# Patient Record
Sex: Female | Born: 1980
Health system: Southern US, Community
[De-identification: ages and names within clinical notes are randomized; demographics above are authoritative.]

---
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
## 2001-11-13 ENCOUNTER — Encounter: Admission: RE | Admit: 2001-11-13 | Discharge: 2001-11-13 | Payer: Self-pay | Admitting: Family Medicine

## 2001-11-13 ENCOUNTER — Encounter: Payer: Self-pay | Admitting: Family Medicine

## 2002-05-16 ENCOUNTER — Encounter: Payer: Self-pay | Admitting: *Deleted

## 2002-05-16 ENCOUNTER — Encounter: Admission: RE | Admit: 2002-05-16 | Discharge: 2002-05-16 | Payer: Self-pay | Admitting: *Deleted

## 2002-11-30 ENCOUNTER — Encounter: Admission: RE | Admit: 2002-11-30 | Discharge: 2002-11-30 | Payer: Self-pay | Admitting: *Deleted

## 2002-11-30 ENCOUNTER — Encounter: Payer: Self-pay | Admitting: *Deleted

## 2005-05-28 ENCOUNTER — Encounter: Admission: RE | Admit: 2005-05-28 | Discharge: 2005-05-28 | Payer: Self-pay | Admitting: *Deleted

## 2005-06-01 ENCOUNTER — Emergency Department (HOSPITAL_COMMUNITY): Admission: EM | Admit: 2005-06-01 | Discharge: 2005-06-01 | Payer: Self-pay | Admitting: Emergency Medicine

## 2005-11-02 ENCOUNTER — Emergency Department (HOSPITAL_COMMUNITY): Admission: EM | Admit: 2005-11-02 | Discharge: 2005-11-02 | Payer: Self-pay | Admitting: Family Medicine

## 2005-12-31 ENCOUNTER — Other Ambulatory Visit: Admission: RE | Admit: 2005-12-31 | Discharge: 2005-12-31 | Payer: Self-pay | Admitting: Obstetrics and Gynecology

## 2006-01-14 ENCOUNTER — Encounter: Admission: RE | Admit: 2006-01-14 | Discharge: 2006-01-14 | Payer: Self-pay | Admitting: Obstetrics and Gynecology

## 2006-02-16 ENCOUNTER — Encounter (INDEPENDENT_AMBULATORY_CARE_PROVIDER_SITE_OTHER): Payer: Self-pay | Admitting: *Deleted

## 2006-02-16 ENCOUNTER — Ambulatory Visit (HOSPITAL_BASED_OUTPATIENT_CLINIC_OR_DEPARTMENT_OTHER): Admission: RE | Admit: 2006-02-16 | Discharge: 2006-02-16 | Payer: Self-pay | Admitting: General Surgery

## 2006-07-13 ENCOUNTER — Other Ambulatory Visit: Admission: RE | Admit: 2006-07-13 | Discharge: 2006-07-13 | Payer: Self-pay | Admitting: Family Medicine

## 2006-09-12 ENCOUNTER — Emergency Department (HOSPITAL_COMMUNITY): Admission: EM | Admit: 2006-09-12 | Discharge: 2006-09-12 | Payer: Self-pay | Admitting: Emergency Medicine

## 2007-04-09 ENCOUNTER — Emergency Department (HOSPITAL_COMMUNITY): Admission: EM | Admit: 2007-04-09 | Discharge: 2007-04-09 | Payer: Self-pay | Admitting: Emergency Medicine

## 2007-04-10 ENCOUNTER — Emergency Department (HOSPITAL_COMMUNITY): Admission: EM | Admit: 2007-04-10 | Discharge: 2007-04-10 | Payer: Self-pay | Admitting: Emergency Medicine

## 2008-09-10 ENCOUNTER — Emergency Department (HOSPITAL_BASED_OUTPATIENT_CLINIC_OR_DEPARTMENT_OTHER): Admission: EM | Admit: 2008-09-10 | Discharge: 2008-09-10 | Payer: Self-pay | Admitting: Emergency Medicine

## 2008-09-18 ENCOUNTER — Emergency Department (HOSPITAL_COMMUNITY): Admission: EM | Admit: 2008-09-18 | Discharge: 2008-09-18 | Payer: Self-pay | Admitting: Emergency Medicine

## 2008-11-11 ENCOUNTER — Inpatient Hospital Stay (HOSPITAL_COMMUNITY): Admission: AD | Admit: 2008-11-11 | Discharge: 2008-11-11 | Payer: Self-pay | Admitting: Family Medicine

## 2009-06-04 ENCOUNTER — Inpatient Hospital Stay (HOSPITAL_COMMUNITY): Admission: AD | Admit: 2009-06-04 | Discharge: 2009-06-07 | Payer: Self-pay | Admitting: Obstetrics and Gynecology

## 2009-06-04 ENCOUNTER — Encounter (INDEPENDENT_AMBULATORY_CARE_PROVIDER_SITE_OTHER): Payer: Self-pay | Admitting: Obstetrics and Gynecology

## 2010-08-13 ENCOUNTER — Emergency Department (HOSPITAL_COMMUNITY): Admission: EM | Admit: 2010-08-13 | Discharge: 2010-08-13 | Payer: Self-pay | Admitting: Family Medicine

## 2010-11-01 ENCOUNTER — Encounter: Payer: Self-pay | Admitting: Obstetrics and Gynecology

## 2010-12-22 LAB — WET PREP, GENITAL: Yeast Wet Prep HPF POC: NONE SEEN

## 2010-12-22 LAB — GC/CHLAMYDIA PROBE AMP, GENITAL: Chlamydia, DNA Probe: NEGATIVE

## 2010-12-22 LAB — POCT PREGNANCY, URINE: Preg Test, Ur: NEGATIVE

## 2011-01-16 LAB — CBC
HCT: 36.5 % (ref 36.0–46.0)
Hemoglobin: 11.9 g/dL — ABNORMAL LOW (ref 12.0–15.0)
Hemoglobin: 9.5 g/dL — ABNORMAL LOW (ref 12.0–15.0)
MCHC: 33.3 g/dL (ref 30.0–36.0)
MCV: 82.2 fL (ref 78.0–100.0)
MCV: 83.4 fL (ref 78.0–100.0)
Platelets: 176 10*3/uL (ref 150–400)
RDW: 13.3 % (ref 11.5–15.5)
WBC: 9.4 10*3/uL (ref 4.0–10.5)

## 2011-01-26 LAB — URINALYSIS, ROUTINE W REFLEX MICROSCOPIC
Leukocytes, UA: NEGATIVE
Nitrite: NEGATIVE
Specific Gravity, Urine: 1.025 (ref 1.005–1.030)
Urobilinogen, UA: 0.2 mg/dL (ref 0.0–1.0)
pH: 6.5 (ref 5.0–8.0)

## 2011-01-26 LAB — WET PREP, GENITAL: Trich, Wet Prep: NONE SEEN

## 2011-01-26 LAB — GC/CHLAMYDIA PROBE AMP, GENITAL
Chlamydia, DNA Probe: NEGATIVE
GC Probe Amp, Genital: NEGATIVE

## 2011-01-26 LAB — CBC
MCHC: 33.4 g/dL (ref 30.0–36.0)
MCV: 85.7 fL (ref 78.0–100.0)
Platelets: 273 10*3/uL (ref 150–400)
RDW: 13 % (ref 11.5–15.5)
WBC: 9.3 10*3/uL (ref 4.0–10.5)

## 2011-01-26 LAB — URINE MICROSCOPIC-ADD ON

## 2011-01-26 LAB — HCG, QUANTITATIVE, PREGNANCY: hCG, Beta Chain, Quant, S: 70096 m[IU]/mL — ABNORMAL HIGH (ref <5)

## 2011-02-23 NOTE — Discharge Summary (Signed)
[name], [name]                ACCOUNT NO.:  [id number]   MEDICAL RECORD NO.:  [id number]          PATIENT TYPE:  INP   LOCATION:  9117                          FACILITY:  [facility]   PHYSICIAN:  Naima A. Dillard, M.D. DATE OF BIRTH:  1981-08-09   DATE OF ADMISSION:  06/04/2009  DATE OF DISCHARGE:  06/07/2009                               DISCHARGE SUMMARY   ADMISSION DIAGNOSES:  1. Intrauterine pregnancy 36-5/7 weeks.  2. Intrauterine growth restriction.  3. Abnormal umbilical cord Dopplers.  4. Oligohydramnios.  5. Breech presentation.  6. Decreased social support.   DISCHARGE DIAGNOSES:  1. Intrauterine pregnancy at 36-5/7 weeks, delivered.  2. Breech presentation.  3. Intrauterine growth restriction.  4. Oligohydramnios.   PROCEDURE:  Primary low transverse cesarean section.   HOSPITAL COURSE:  [name] is a 30 year old, gravida 1, para 0, admitted  at 36-5/[redacted] weeks gestation for cesarean section due to intrauterine  growth restriction, oligohydramnios with abnormal umbilical artery  Dopplers and breech presentation.  The patient's pregnancy remarkable  for:  1. Breech presentation, oligohydramnios, and intrauterine growth      restriction, absent end-diastolic flow on umbilical artery      Dopplers.  2. Decreased social support.  3. Moderate subchorionic hemorrhage in 7 weeks.  4. Questionable MMR exposure in early first trimester.   Upon admission, the [facility]. The patient was counseled regarding the risks, benefits, and  alternatives of cesarean section in light of the fetal findings and  desired to proceed.  The patient underwent a primary low transverse  cesarean section and delivered a viable female infant, Clement Husbands with  Apgars of 8 and 9 at one and five minutes respectively, weighing 4  pounds 7 ounces and a cord pH of 7.26.  Surgery was uncomplicated.  Estimated blood loss 700 mL.   On postoperative day #1,  the patient was ambulating, voiding, and  tolerating p.o. liquids and solids without difficulty.  The patient's  hemoglobin on postoperative day #1, was 9.5.  The patient was  asymptomatic.   On postoperative day, the patient remained stable without complaints or  complications.   On postoperative day #3, the patient remained afebrile and without  complaints.  The patient's pain was well controlled and the patient  agreed with discharge home.  The patient was discharged home in stable  condition.  It was felt that she had received full benefit of her  hospital stay.   Discharge instructions per [facility] handout.   DISCHARGE MEDICATIONS:  1. Motrin 600 mg p.o. q.6 h. p.r.n. pain.  2. Tylox 1-2 tablets every 4-6 hours p.r.n. pain.  3. Integra Plus iron supplement 1 tablet p.o. daily.   The patient will follow up in the office at [facility] in 4-  6 weeks.      [name], CNM      [name], M.D.  Electronically Signed    NOS/MEDQ  D:  06/07/2009  T:  06/08/2009  Job:  [id number]

## 2011-02-23 NOTE — H&P (Signed)
Fry, [name]                ACCOUNT NO.:  [id number]   MEDICAL RECORD NO.:  [id number]          PATIENT TYPE:  MAT   LOCATION:  MATC                          [facility]ILITY:  [facility]   PHYSICIAN:  Crist Fat. Rivard, M.D. DATE OF BIRTH:  1981-06-13   DATE OF ADMISSION:  06/04/2009  DATE OF DISCHARGE:                              HISTORY & PHYSICAL   [name] is a 30 year old gravida 1, para 0, at 36-5/7 weeks who  presented from the office for C-section secondary to breech  presentation, oligohydramnios, IUGR and absent end-diastolic flow but a  BPP of 8/8 in the office.  She denies leaking or bleeding and reports  positive fetal movement.  Her Group B strep culture was done today.  Additional findings on ultrasound today showed estimated fetal weight  less than 10th percentile at 4 pounds 13 ounces.  AFI was 5.2 cm which  is less than third percentile and a frank breech presentation.  The  patient was sent to maternity admissions unit for cesarean section in  light of all these findings.   Pregnancy has been remarkable the following:  1. Decreased social support.  2. Moderate subchorionic hemorrhage at 7 weeks.  3. Questionable MMR exposure in early first trimester.  4. Normal AFI and growth at 36 weeks.  5. Group B strep pending from today.   PRENATAL LABORATORIES:  Blood type is A positive, Rh antibody negative,  VDRL nonreactive, rubella titer positive, hepatitis B surface antigen  negative, HIV nonreactive.  GC and Chlamydia cultures were negative in  February 2010.  Pap was normal in August 2009.  Hemoglobin upon entering  the practice was 11.5.  It was 11.3 at 28 weeks.  RPR was nonreactive at  that time.  Glucola was 88.  She had a normal quadruple screen.  Group B  strep culture and GC/Chlamydia cultures were done today.   HISTORY OF PRESENT PREGNANCY:  The patient entered care at approximately  14 weeks.  She had had a Pap done at [facility]  in  August 2009 which was normal.  She had cultures done at maternity  admissions unit in February 2010.  This was an unplanned pregnancy with  unsure maternity.  The patient was very stressed in the early pregnancy,  and she had not told her parents early in pregnancy.  She had a  quadruple screen that was normal.  [facility] was contacted and did make  connection with the patient.  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  She had an ultrasound at 19 weeks showing normal growth and  fluid.  By that time, she had told her mother about her pregnancy.  She  had some sinusitis at 19 weeks and was treated with a Z-Pak.  However,  she said that she had difficulty with pills.  Therefore, she was given  amoxicillin liquid.  The potential father of the baby entered back in  the picture at 21 weeks.  The patient had a Glucola at 26 weeks that was  normal.  At 31 weeks, she  advised [place] that she may have had her MMR  during the early first trimester.  She had an ultrasound then done at 33  weeks showing normal growth and fluid.  There was noted to be the cord  possibly along the face.  Dr. Pennie Rushing recommended a follow-up ultrasound  be done that was done at 36-5/7 weeks today which showed the findings as  noted, estimated fetal weight of 4 pounds 13 ounces which was less than  10th percentile, AFI of 5.26 cm which was less than the third  percentile, but a BPP of 8/8 and an anterior grade 2 placenta.  Breech  presentation was also noted.  Cervix was fingertip 50% with the  presenting part out of the pelvis.  There was absent end-diastolic flow.  Therefore, the patient was sent to maternity admissions unit for  cesarean section tonight.   OBSTETRICAL HISTORY:  The patient is a primigravida.   MEDICAL HISTORY:  In 2004, she had Chlamydia and Trichomonas diagnosed.  She reports usual childhood illnesses.  In 2002, she had a UTI.  She had  a history of breast cyst in 2007.   SURGICAL HISTORY:   Includes 2007 breast cyst fibroadenoma removal.   GENETIC HISTORY:  Unremarkable except that there is a question of  paternity.   FAMILY HISTORY:  Her mother and maternal grandmother have hypertension.  Her sister and mother have anemia.  Her maternal uncle has diabetes.  Her mother is hypothyroid.  Maternal uncle had some type of unknown  psychiatric diagnosis.   The patient has no known medication allergies.   SOCIAL HISTORY:  The patient is Tree surgeon.  She is of the  [place] faith.  She is unsure of paternity.  She has college  education, but she is also in Health visitor at [employer].  She is single.  She denies any alcohol, drug or tobacco use  during this pregnancy.  She currently is accompanied by her mother and  grandmother.   PHYSICAL EXAMINATION:  VITAL SIGNS:  Stable.  The patient is febrile.  HEENT:  Within normal limits.  LUNGS:  Bilateral breath sounds are clear.  HEART:  Regular rate and rhythm without murmur.  BREASTS:  Soft and nontender.  ABDOMEN:  Fundal height is measuring approximately 34 weeks.  Fetus is  breech with a vertex in the upper quadrants.  Fetal heart rate is  reassuring.  There are some occasional contractions noted.  There are no  decelerations noted.  PELVIC:  Exam is deferred.  EXTREMITIES:  Deep tendon reflexes are 2+ without clonus.  There is a  trace edema noted.   IMPRESSION:  1. Intrauterine pregnancy at 36-5/7 weeks.  2. Oligohydramnios.  3. Intrauterine growth retardation.  4. Absent end-diastolic flow but reassuring NST and biophysical      profile.  5. Group B strep pending.   PLAN:  1. Admit to [facility] per consult with Dr. Estanislado Pandy as      attending physician.  2. Plan cesarean section tonight per consult with Dr. Estanislado Pandy.  3. Risks and benefits of C-section reviewed with the patient and her      family including bleeding, infection and damage to other organs.      The patient  and her family seem to understand these risks and wish      to proceed.  We      also reviewed the findings from the ultrasound today including  implications for fetal compromise in light of IUGR,      oligohydramnios, and absent end-diastolic flow.  The patient and      her family also seem to understand the need to proceed with      cesarean section tonight.      Renaldo Reel Emilee Hero, C.N.M.      Crist Fat Rivard, M.D.  Electronically Signed    VLL/MEDQ  D:  06/04/2009  T:  06/04/2009  Job:  [id number]

## 2011-02-23 NOTE — Op Note (Signed)
[name], [name]                ACCOUNT NO.:  [id number]   MEDICAL RECORD NO.:  [id number]          PATIENT TYPE:  INP   LOCATION:  9117                          [facility]ACILITY:  [facility]   PHYSICIAN:  [name], M.D. DATE OF BIRTH:  1981-09-12   DATE OF PROCEDURE:  06/04/2009  DATE OF DISCHARGE:                               OPERATIVE REPORT   PREOPERATIVE DIAGNOSES:  Intrauterine pregnancy at 36 weeks and 5 days  with intrauterine growth restriction, oligohydramnios, breech  presentation, and abnormal Doppler with absent diastolic flow.   POSTOPERATIVE DIAGNOSES:  Intrauterine pregnancy at 36 weeks and 5 days  with intrauterine growth restriction, oligohydramnios, breech  presentation, and abnormal Doppler with absent diastolic flow with  uterine fibroid.   ANESTHESIA:  Spinal, Jennifer Quince, MD   PROCEDURE:  Primary low-transverse cesarean section and myomectomy.   SURGEON:  Jennifer Fat. Rivard, MD   ASSISTANT:  [name], certified nurse midwife.   ESTIMATED BLOOD LOSS:  700 mL.   PROCEDURE:  After being informed of the planned procedure with possible  complications including bleeding, infection, injury to other organs such  as bladder, bowels, or ureters, informed consent was obtained.  The  patient was taken to OR #2, given spinal anesthesia by Dr. Arby Fry  without any complication.  She was placed in the dorsal decubitus  position, pelvis tilted to the left, prepped and draped in a sterile  fashion, and a Foley catheter was inserted in her bladder.   After assessing adequate level of anesthesia, we infiltrated the  suprapubic area with 20 mL of Marcaine 0.25 and performed a Pfannenstiel  incision which was brought down sharply to the fascia.  The fascia was  incised in a low transverse fashion.  Linea alba was dissected.  Peritoneum was entered in the midline fashion.  An Alexis retractor was  easily inserted and the visceral peritoneum was entered in a low  transverse fashion allowing [place] to safely retract bladder by developing a  bladder flap.  The myometrium was then entered in a low transverse  fashion first with knife then extended bluntly.  We see a very small  amount of clear amniotic fluid.  We assist the birth of a female infant in  frank breech at 8:59 p.m. without difficulty.  Mouth and nose were  suctioned.  Cord was clamped with 2 Kelly clamps and sectioned and the  baby was given to Dr. Alison Fry, neonatologist present in the room.  Cord  pH was drawn from the umbilical artery and 10 mL of blood was drawn from  the umbilical vein.  The placenta was allowed to deliver spontaneously.  It was complete, cord has 3 vessels, and uterine revision was negative.  The placenta was heavily calcified.  We note at this moment a subserosal  2-cm fibroid at the fundus which was easily removed with a small 1-cm  incision performed with cauterization.  The defect in the superficial  myometrium and serosa was closed with 3 figure-of-eight stitches of 0  Vicryl.  Hemostasis was adequate.  We then proceeded with closure of the  myometrium in 2 layers, first with a running lock suture of 0 Vicryl  then with a Lembert suture of 0 Vicryl imbricating the first one.  Hemostasis was completed in the midline with the figure-of-eight stitch  of 0 Vicryl and on peritoneal edges with cauterization.  Both paracolic  gutters were cleaned.  Both tubes and ovaries assessed and normal and we  proceeded with profuse irrigation of the pelvis with warm saline.  Again, hemostasis was confirmed to be adequate.   Retractors were removed.  Under fascia hemostasis was completed with  cautery and the fascia was closed with 2 running sutures of 1 Vicryl  meeting in midline.  The wound was irrigated with warm saline.  Hemostasis was completed with cautery and the skin was closed with a  subcuticular suture of 3-0 Monocryl and Steri-Strips.   Instruments and sponge count was  complete x2.  Estimated blood loss is  700 mL.  The procedure was very well tolerated by the patient, who was  taken to recovery room in a well and stable condition.   Little boy named [name] was born at 8:59 p.m., received an Apgar  of 8 at 1 minute and 9 at 5 minutes, weighed 4 pounds 7 ounces, had a  cord pH of 7.26, and was taken to full-term nursery.   SPECIMEN:  Placenta and fibroid sent to Pathology.      [name], M.D.  Electronically Signed     SAR/MEDQ  D:  06/04/2009  T:  06/05/2009  Job:  [id number]

## 2011-02-26 NOTE — Op Note (Signed)
[name], [name]                ACCOUNT NO.:  [id number]   MEDICAL RECORD NO.:  [id number]          PATIENT TYPE:  AMB   LOCATION:  DSC                          FACILITY:  [facility]   PHYSICIAN:  Leonie Man, M.D.   DATE OF BIRTH:  12-Jul-1981   DATE OF PROCEDURE:  02/16/2006  DATE OF DISCHARGE:                                 OPERATIVE REPORT   PREOPERATIVE DIAGNOSIS:  Fibroadenoma right breast, rule out cystosarcoma.   POSTOPERATIVE DIAGNOSES:  Fibroadenoma right breast, rule out cystosarcoma,  pathology pending.   PROCEDURE:  Excisional biopsy right breast masses x3.   SURGEON:  Leonie Man, M.D.   ASSISTANT:  OR tech.   ANESTHESIA:  General.   Ms. Balint is a 30 year old female presenting with an enlarging mass in the  right breast which has been followed for some time.  The mass has gone to  the size of approximately 9 cm and the patient presents now for excision.   PROCEDURE:  Following induction of satisfactory general anesthesia, the  patient is positioned supinely.  The right breast is prepped and draped to  be included in a sterile operative field.  A circumareolar incision is  carried down on the inferior border of the right nipple, deepened through  skin and subcutaneous tissue with dissection carried down to the largest the  masses.  This is dissected free in its entirety and forwarded for pathologic  evaluation.  This mass was located at the 6 o'clock axis.  There was  somewhat smaller mass located at 8 o'clock axis and dissection was carried  down but undermining through the breast tissue to this mass and mass was  grasped and dissected free up to surrounding tissue and again at  approximately the 5 o'clock axis,  there was another bilobar mass which was  dissected free and removed and forwarded for pathologic evaluation.  Hemostasis was obtained with electrocautery.  Sponge and instrument counts  verified.  Breast tissues reapproximated with 3-0 Vicryl  sutures.  Skin  closed with 5-0 Monocryl suture and then reinforced with Steri-Strips.  Sterile dressings applied.  Anesthetic reversed.  The patient removed from  the operating room to the recovery room in stable condition.  She tolerated  the procedure well.      Leonie Man, M.D.  Electronically Signed     PB/MEDQ  D:  02/16/2006  T:  02/17/2006  Job:  [id number]

## 2011-11-05 ENCOUNTER — Encounter (HOSPITAL_COMMUNITY): Payer: Self-pay | Admitting: *Deleted

## 2011-11-05 ENCOUNTER — Emergency Department (HOSPITAL_COMMUNITY)
Admission: EM | Admit: 2011-11-05 | Discharge: 2011-11-05 | Disposition: A | Discharge status: Home / Self Care | Payer: BC Managed Care – PPO | Attending: Emergency Medicine | Admitting: Emergency Medicine

## 2011-11-05 ENCOUNTER — Other Ambulatory Visit: Payer: Self-pay

## 2011-11-05 ENCOUNTER — Emergency Department (HOSPITAL_COMMUNITY): Payer: BC Managed Care – PPO

## 2011-11-05 DIAGNOSIS — R0789 Other chest pain: Secondary | ICD-10-CM | POA: Insufficient documentation

## 2011-11-05 LAB — POCT I-STAT, CHEM 8
BUN: 11 mg/dL (ref 6–23)
Calcium, Ion: 1.27 mmol/L (ref 1.12–1.32)
Chloride: 104 mEq/L (ref 96–112)
Creatinine, Ser: 0.8 mg/dL (ref 0.50–1.10)
TCO2: 26 mmol/L (ref 0–100)

## 2011-11-05 LAB — POCT I-STAT TROPONIN I

## 2011-11-05 NOTE — ED Provider Notes (Signed)
History     CSN: [id number]  Arrival date & time 11/05/11  2019   First MD Initiated Contact with Patient 11/05/11 2043      Chief Complaint  Patient presents with  . Chest Pain    (Consider location/radiation/quality/duration/timing/severity/associated sxs/prior treatment)  Patient is a 31 y.o. female presenting with chest pain. The history is provided by the patient.  Chest Pain   31 year old [employer] female who is otherwise healthy presents with chest tightness that began yesterday morning while at work. The onset was gradual. She states the chest tightness is constant. She denies any aggravating factors. Resting last night and today helped relieve some of the tightness but it never fully went away. She did not try any medicine for relief. Patient states she thinks its due to stress at work. 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 She has had similar episodes of chest tightness when under a lot of stress at work but never this bad. She denies palpitations, leg swelling, diaphoresis, fever, and cough. She had some nausea and headache yesterday but not currently. She denies any family history of heart disease. She denies smoking, hypertension, high cholesterol, and diabetes.   History reviewed. No pertinent past medical history.  History reviewed. No pertinent past surgical history.  History reviewed. No pertinent family history.  History  Substance Use Topics  . Smoking status: Never Smoker   . Smokeless tobacco: Not on file  . Alcohol Use: Yes    OB History    Grav Para Term Preterm Abortions TAB SAB Ect Mult Living                  Review of Systems  Cardiovascular: Positive for chest pain.  All pertinent positives and negatives reviewed in the history of present illness  Allergies  Review of patient's allergies indicates no known allergies.  Home Medications   Current Outpatient Rx  Name Route Sig Dispense Refill  . WOMENS DAILY FORMULA PO Oral Take 1 tablet by  mouth daily.      BP 125/85  Pulse 82  Temp(Src) 97.1 F (36.2 C) (Oral)  Resp 18  SpO2 100%  Physical Exam  Constitutional: She is oriented to person, place, and time. She appears well-developed and well-nourished. No distress.  HENT:  Head: Normocephalic and atraumatic.  Cardiovascular: Normal rate, regular rhythm, normal heart sounds and intact distal pulses.   No murmur heard. Pulmonary/Chest: Effort normal and breath sounds normal. No respiratory distress. She has no wheezes. She exhibits no tenderness.  Abdominal: Soft. Bowel sounds are normal. She exhibits no distension. There is no tenderness.  Musculoskeletal: Normal range of motion.  Neurological: She is alert and oriented to person, place, and time.  Skin: Skin is warm and dry. She is not diaphoretic.  Psychiatric: She has a normal mood and affect. Her behavior is normal.    ED Course  Procedures (including critical care time)   Dg Chest 2 View  11/05/2011  *RADIOLOGY REPORT*  Clinical Data: Chest pain  CHEST - 2 VIEW  Comparison: None  Findings: Normal mediastinum and cardiac silhouette.  Normal pulmonary  vasculature.  No evidence of effusion, infiltrate, or pneumothorax.  No acute bony abnormality.  Sigmoid scoliosis.  IMPRESSION: No acute cardiopulmonary process.  Original Report Authenticated By: Genevive Bi, M.D.       10:23 PM Checked on patient. States that the chest tightness has relieved some. Patient denies any medication for relief. She feels this is most  likely due to stress and anxiety from work.    MDM  Patient states that she is under a lot of stress and when her stress at work increases this is when this pain begins.  Patient is in no acute distress on exam.  Patient is perc negative.     Date: 11/05/2011  Rate: 85  Rhythm: normal sinus rhythm  QRS Axis: normal  Intervals: normal  ST/T Wave abnormalities: normal  Conduction Disutrbances:none  Narrative Interpretation:   Old EKG  Reviewed: none available  MDM Reviewed: nursing note and vitals Interpretation: ECG, labs and x-ray     Medical screening examination/treatment/procedure(s) were performed by non-physician practitioner and as supervising physician I was immediately available for consultation/collaboration. Osvaldo Human, M.D.       Carlyle Dolly, PA-C 11/05/11 2338  Carleene Cooper III, MD 11/06/11 [phone]

## 2011-11-05 NOTE — ED Provider Notes (Signed)
8:53 PM  Date: 11/05/2011  Rate:85  Rhythm: normal sinus rhythm  QRS Axis: normal  Intervals: normal  ST/T Wave abnormalities: normal  Conduction Disutrbances:none  Narrative Interpretation: Normal EKg  Old EKG Reviewed: none available    Carleene Cooper III, MD 11/05/11 2054

## 2011-11-05 NOTE — ED Notes (Signed)
The pt has had some generalized chest pain since yesterday with sl sob..   SHE HAS A HISTORY OF THE SAME.  No visible sob at present

## 2012-03-13 ENCOUNTER — Emergency Department (HOSPITAL_BASED_OUTPATIENT_CLINIC_OR_DEPARTMENT_OTHER)
Admission: EM | Admit: 2012-03-13 | Discharge: 2012-03-13 | Disposition: A | Discharge status: Home / Self Care | Payer: Self-pay | Attending: Emergency Medicine | Admitting: Emergency Medicine

## 2012-03-13 ENCOUNTER — Encounter (HOSPITAL_BASED_OUTPATIENT_CLINIC_OR_DEPARTMENT_OTHER): Payer: Self-pay | Admitting: Family Medicine

## 2012-03-13 DIAGNOSIS — H109 Unspecified conjunctivitis: Secondary | ICD-10-CM | POA: Insufficient documentation

## 2012-03-13 MED ORDER — TETRACAINE HCL 0.5 % OP SOLN
OPHTHALMIC | Status: AC
  Administered 2012-03-13: 13:00:00
  Filled 2012-03-13: qty 2

## 2012-03-13 MED ORDER — FLUORESCEIN SODIUM 1 MG OP STRP
ORAL_STRIP | OPHTHALMIC | Status: AC
  Administered 2012-03-13: 13:00:00
  Filled 2012-03-13: qty 1

## 2012-03-13 MED ORDER — MOXIFLOXACIN HCL 0.5 % OP SOLN: OPHTHALMIC | Status: DC

## 2012-03-13 NOTE — ED Provider Notes (Signed)
Medical screening examination/treatment/procedure(s) were performed by non-physician practitioner and as supervising physician I was immediately available for consultation/collaboration.   Forbes Cellar, MD 03/13/12 [phone]

## 2012-03-13 NOTE — ED Notes (Signed)
Pt sts she woke up yesterday evening with drainage to right eye and sts she feels like something is in it. Pt sts vision is ok, pt drove here. Pt is not currently wearing contact lenses.

## 2012-03-13 NOTE — ED Notes (Signed)
Patient is able to read line 4 (20/50) with both eyes; patient is able to read line 3 (20/70) with the left eye; and reports extremely blurry vision with the right eye and able to read line 3 (20/70).  Patient also reports that she normally wears contacts to correct her vision but is not wearing those today.

## 2012-03-13 NOTE — ED Provider Notes (Signed)
History     CSN: [id number]  Arrival date & time 03/13/12  1145   First MD Initiated Contact with Patient 03/13/12 1206      Chief Complaint  Patient presents with  . Eye Drainage    (Consider location/radiation/quality/duration/timing/severity/associated sxs/prior treatment) HPI Comments: Pt states that she noticed some drainage from her eye yesterday and she feels a sensation of possible fb:pt has not worn her lenses since the initial sensation yesterday  Patient is a 31 y.o. female presenting with conjunctivitis. The history is provided by the patient. No language interpreter was used.  Conjunctivitis  The current episode started yesterday. The problem occurs continuously. The problem has been unchanged. The problem is mild. The symptoms are relieved by nothing. The symptoms are aggravated by nothing. Associated symptoms include eye discharge and eye redness. Pertinent negatives include no fever, no double vision, no congestion and no ear discharge.    History reviewed. No pertinent past medical history.  Past Surgical History  Procedure Date  . Cesarean section     No family history on file.  History  Substance Use Topics  . Smoking status: Never Smoker   . Smokeless tobacco: Not on file  . Alcohol Use: Yes    OB History    Grav Para Term Preterm Abortions TAB SAB Ect Mult Living                  Review of Systems  Constitutional: Negative for fever.  HENT: Negative for congestion and ear discharge.   Eyes: Positive for discharge and redness. Negative for double vision.  Respiratory: Negative.   Cardiovascular: Negative.   Neurological: Negative.     Allergies  Review of patient's allergies indicates no known allergies.  Home Medications   Current Outpatient Rx  Name Route Sig Dispense Refill  . MOXIFLOXACIN HCL 0.5 % OP SOLN  1 gtt q 2 hours which awake times 2 days, then every 6 hours times 5 days 3 mL 0  . WOMENS DAILY FORMULA PO Oral Take 1 tablet  by mouth daily.      BP 123/75  Pulse 64  Temp(Src) 98.4 F (36.9 C) (Oral)  Resp 16  Ht 5\' 4"  (1.626 m)  Wt 120 lb (54.432 kg)  BMI 20.60 kg/m2  SpO2 100%  LMP 02/16/2012  Physical Exam  Nursing note and vitals reviewed. Constitutional: She appears well-developed and well-nourished.  HENT:  Head: Normocephalic and atraumatic.  Right Ear: External ear normal.  Left Ear: External ear normal.  Eyes: EOM are normal. Pupils are equal, round, and reactive to light. Right conjunctiva is injected.  Slit lamp exam:      The right eye shows no corneal ulcer, no foreign body and no fluorescein uptake.  Neck: Neck supple.  Cardiovascular: Normal rate and regular rhythm.   Pulmonary/Chest: Effort normal and breath sounds normal.    ED Course  Procedures (including critical care time)  Labs Reviewed - No data to display No results found.   1. Conjunctivitis       MDM  No fb noted:pt instructed on note wearing lenses and pt treated with moxi because of the contact lenses use        Teressa Lower, NP 03/13/12 1236

## 2012-03-13 NOTE — Discharge Instructions (Signed)
Conjunctivitis Conjunctivitis is commonly called "pink eye." Conjunctivitis can be caused by bacterial or viral infection, allergies, or injuries. There is usually redness of the lining of the eye, itching, discomfort, and sometimes discharge. There may be deposits of matter along the eyelids. A viral infection usually causes a watery discharge, while a bacterial infection causes a yellowish, thick discharge. Pink eye is very contagious and spreads by direct contact. You may be given antibiotic eyedrops as part of your treatment. Before using your eye medicine, remove all drainage from the eye by washing gently with warm water and cotton balls. Continue to use the medication until you have awakened 2 mornings in a row without discharge from the eye. Do not rub your eye. This increases the irritation and helps spread infection. Use separate towels from other household members. Wash your hands with soap and water before and after touching your eyes. Use cold compresses to reduce pain and sunglasses to relieve irritation from light. Do not wear contact lenses or wear eye makeup until the infection is gone. SEEK MEDICAL CARE IF:   Your symptoms are not better after 3 days of treatment.   You have increased pain or trouble seeing.   The outer eyelids become very red or swollen.  Document Released: 11/04/2004 Document Revised: 09/16/2011 Document Reviewed: 09/27/2005 Exit[employer] Patient Information 2012 [employer]. 

## 2012-03-17 ENCOUNTER — Encounter (HOSPITAL_COMMUNITY): Payer: Self-pay | Admitting: Emergency Medicine

## 2012-03-17 ENCOUNTER — Emergency Department (HOSPITAL_COMMUNITY)
Admission: EM | Admit: 2012-03-17 | Discharge: 2012-03-17 | Disposition: A | Discharge status: Home / Self Care | Payer: Self-pay | Attending: Emergency Medicine | Admitting: Emergency Medicine

## 2012-03-17 DIAGNOSIS — H16209 Unspecified keratoconjunctivitis, unspecified eye: Secondary | ICD-10-CM

## 2012-03-17 DIAGNOSIS — H11429 Conjunctival edema, unspecified eye: Secondary | ICD-10-CM | POA: Insufficient documentation

## 2012-03-17 MED ORDER — KETOROLAC TROMETHAMINE 60 MG/2ML IM SOLN
60.0000 mg | Freq: Once | INTRAMUSCULAR | Status: AC
  Administered 2012-03-17: 60 mg via INTRAMUSCULAR
  Filled 2012-03-17: qty 2

## 2012-03-17 NOTE — Discharge Instructions (Signed)
Follow up at Dr. Barbaraann Barthel office tomorrow at 1 pm. Return here as needed. Use cool compresses and artificial tears.

## 2012-03-17 NOTE — ED Provider Notes (Signed)
History     CSN: [id number]  Arrival date & time 03/17/12  1916   First MD Initiated Contact with Patient 03/17/12 2022      Chief Complaint  Patient presents with  . Eye Pain    (Consider location/radiation/quality/duration/timing/severity/associated sxs/prior treatment) HPI The patient presents to the Emergency department with continued eye discomfort.  Patient was seen att the ER Monday and given antibacterial eyedrops and referred ophthalmology.  Patient, says she followed up with the ophthalmologist on Wednesday.  She states that he changed her to tobramycin eyedrops, states that her eye did not improve and she noticed more swelling.  The patient is worsening vision, fever, dizziness, ear pain, or nausea/vomiting.  History reviewed. No pertinent past medical history.  Past Surgical History  Procedure Date  . Cesarean section   . Cesarean section     No family history on file.  History  Substance Use Topics  . Smoking status: Never Smoker   . Smokeless tobacco: Not on file  . Alcohol Use: Yes    OB History    Grav Para Term Preterm Abortions TAB SAB Ect Mult Living                  Review of Systems All other systems negative except as documented in the HPI. All pertinent positives and negatives as reviewed in the HPI.  Allergies  Review of patient's allergies indicates no known allergies.  Home Medications   Current Outpatient Rx  Name Route Sig Dispense Refill  . WOMENS DAILY FORMULA PO Oral Take 1 tablet by mouth daily.      BP 123/80  Pulse 73  Temp(Src) 98.4 F (36.9 C) (Oral)  SpO2 100%  LMP 02/16/2012  Physical Exam  Nursing note and vitals reviewed. Constitutional: She appears well-developed and well-nourished. No distress.  HENT:  Right Ear: Tympanic membrane normal.  Left Ear: Tympanic membrane normal.  Nose: Nose normal.  Mouth/Throat: Uvula is midline, oropharynx is clear and moist and mucous membranes are normal.  Eyes: Pupils are  equal, round, and reactive to light. Right eye exhibits chemosis. Right eye exhibits no discharge and no exudate. No foreign body present in the right eye. Right conjunctiva is injected.  Cardiovascular: Normal rate and regular rhythm.   Pulmonary/Chest: Effort normal and breath sounds normal.  Skin: Skin is warm and dry. No rash noted.    ED Course  Procedures (including critical care time)  I spoke with Dr. Luciana Axe from ophthalmology and he agrees to the patient in followup tomorrow at 1 PM.  Patient is given the plan and voices an understanding.  Patient is advised to stop the tobramycin eyedrops.  She is advised to use Compresses and artificial tears  She is advised to return if any worsening in her condition.  MDM          Carlyle Dolly, PA-C 03/18/12 7829

## 2012-03-17 NOTE — ED Notes (Signed)
Pt states she was seen at [facility] on Monday and given medication for Conjunctivitis. States she followed up with the eye doctor because of no relief and he switched her medication. Pt is following up here because of continued itchy, watery, painful R eye.

## 2012-03-18 NOTE — ED Provider Notes (Signed)
Medical screening examination/treatment/procedure(s) were performed by non-physician practitioner and as supervising physician I was immediately available for consultation/collaboration.   Dayton Bailiff, MD 03/18/12 1450

## 2013-03-21 IMAGING — CR DG CHEST 2V
2 series · 2 of 2 positions shown · non-contrast
Comparison: None

CLINICAL DATA: Chest pain

CHEST - 2 VIEW

[w chest pa]
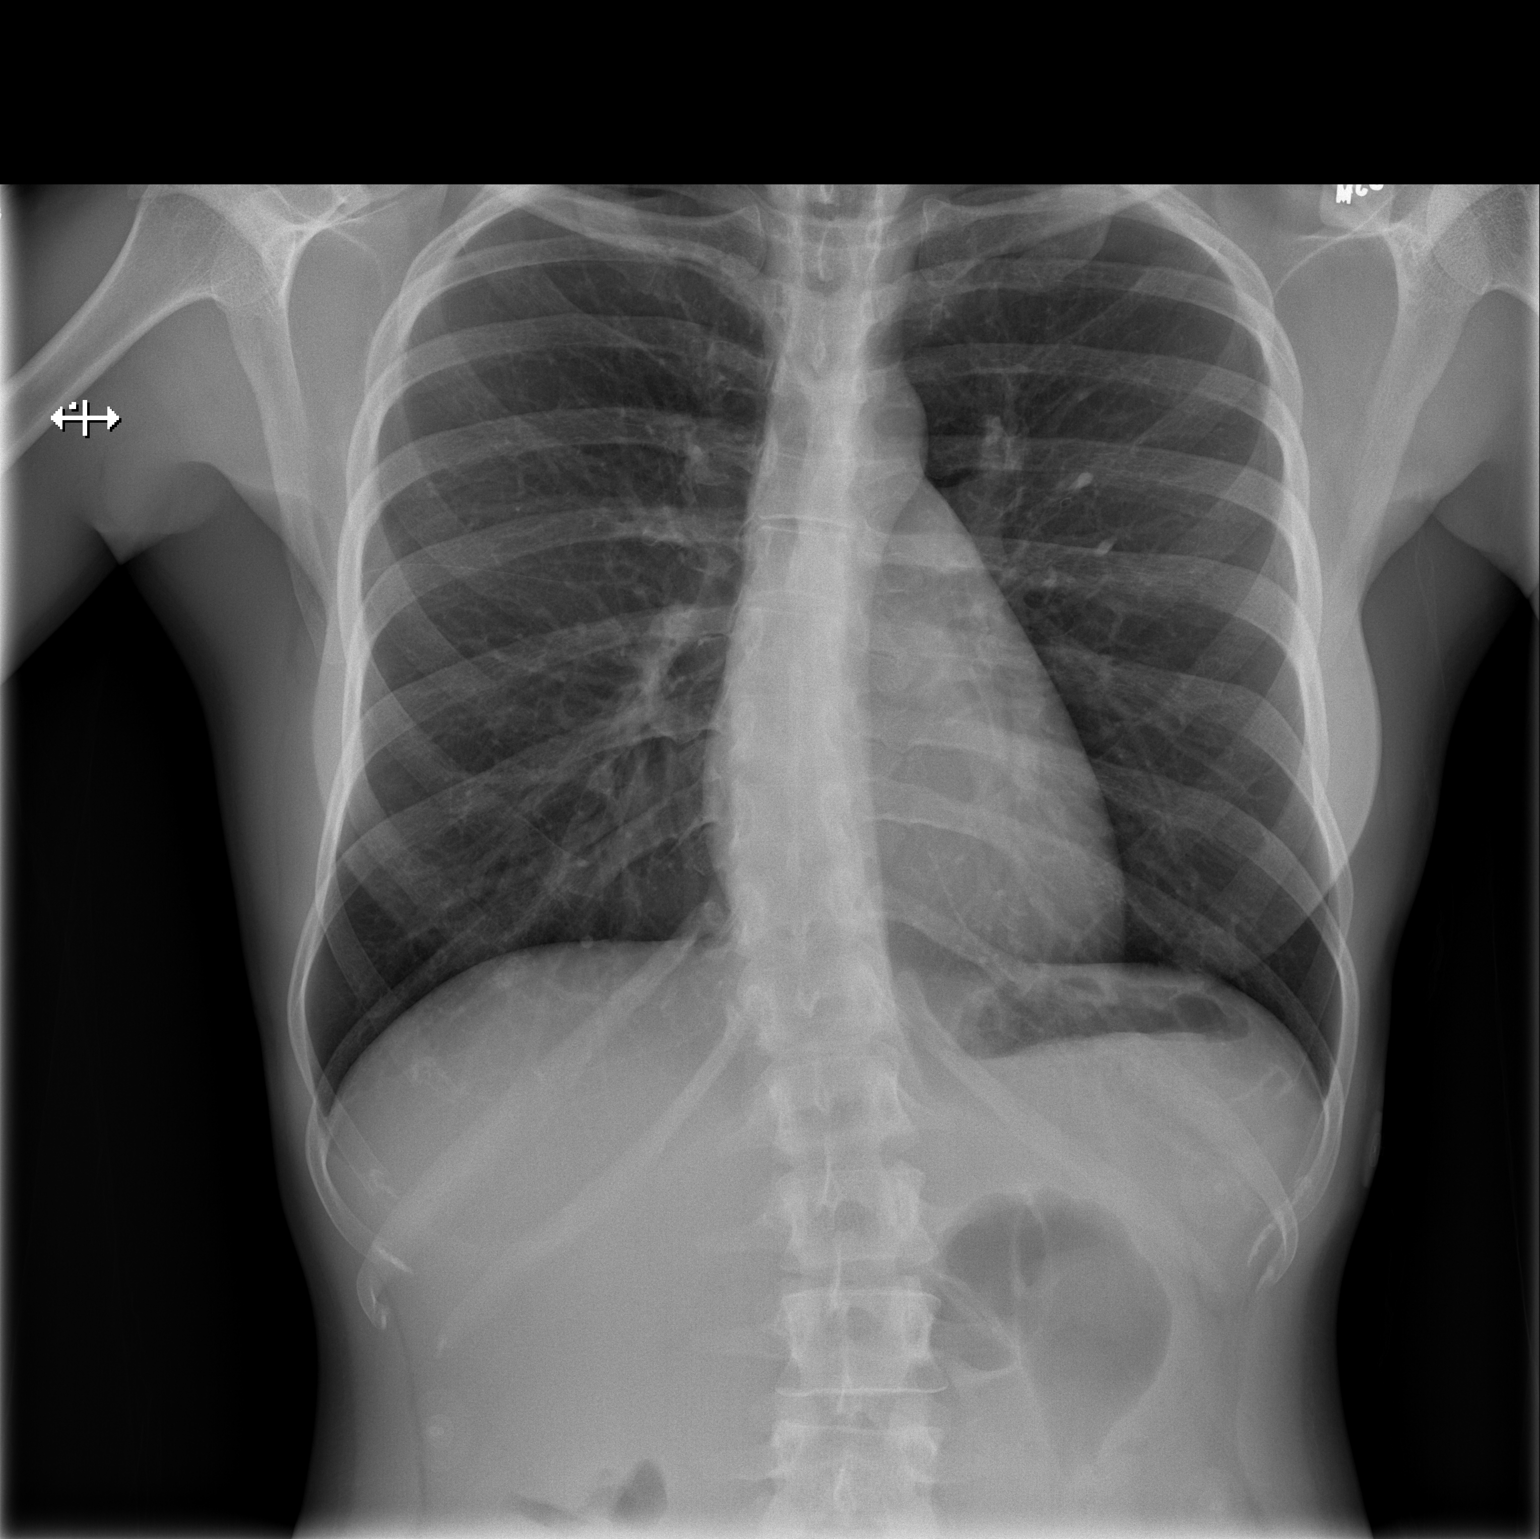

[w chest lat]
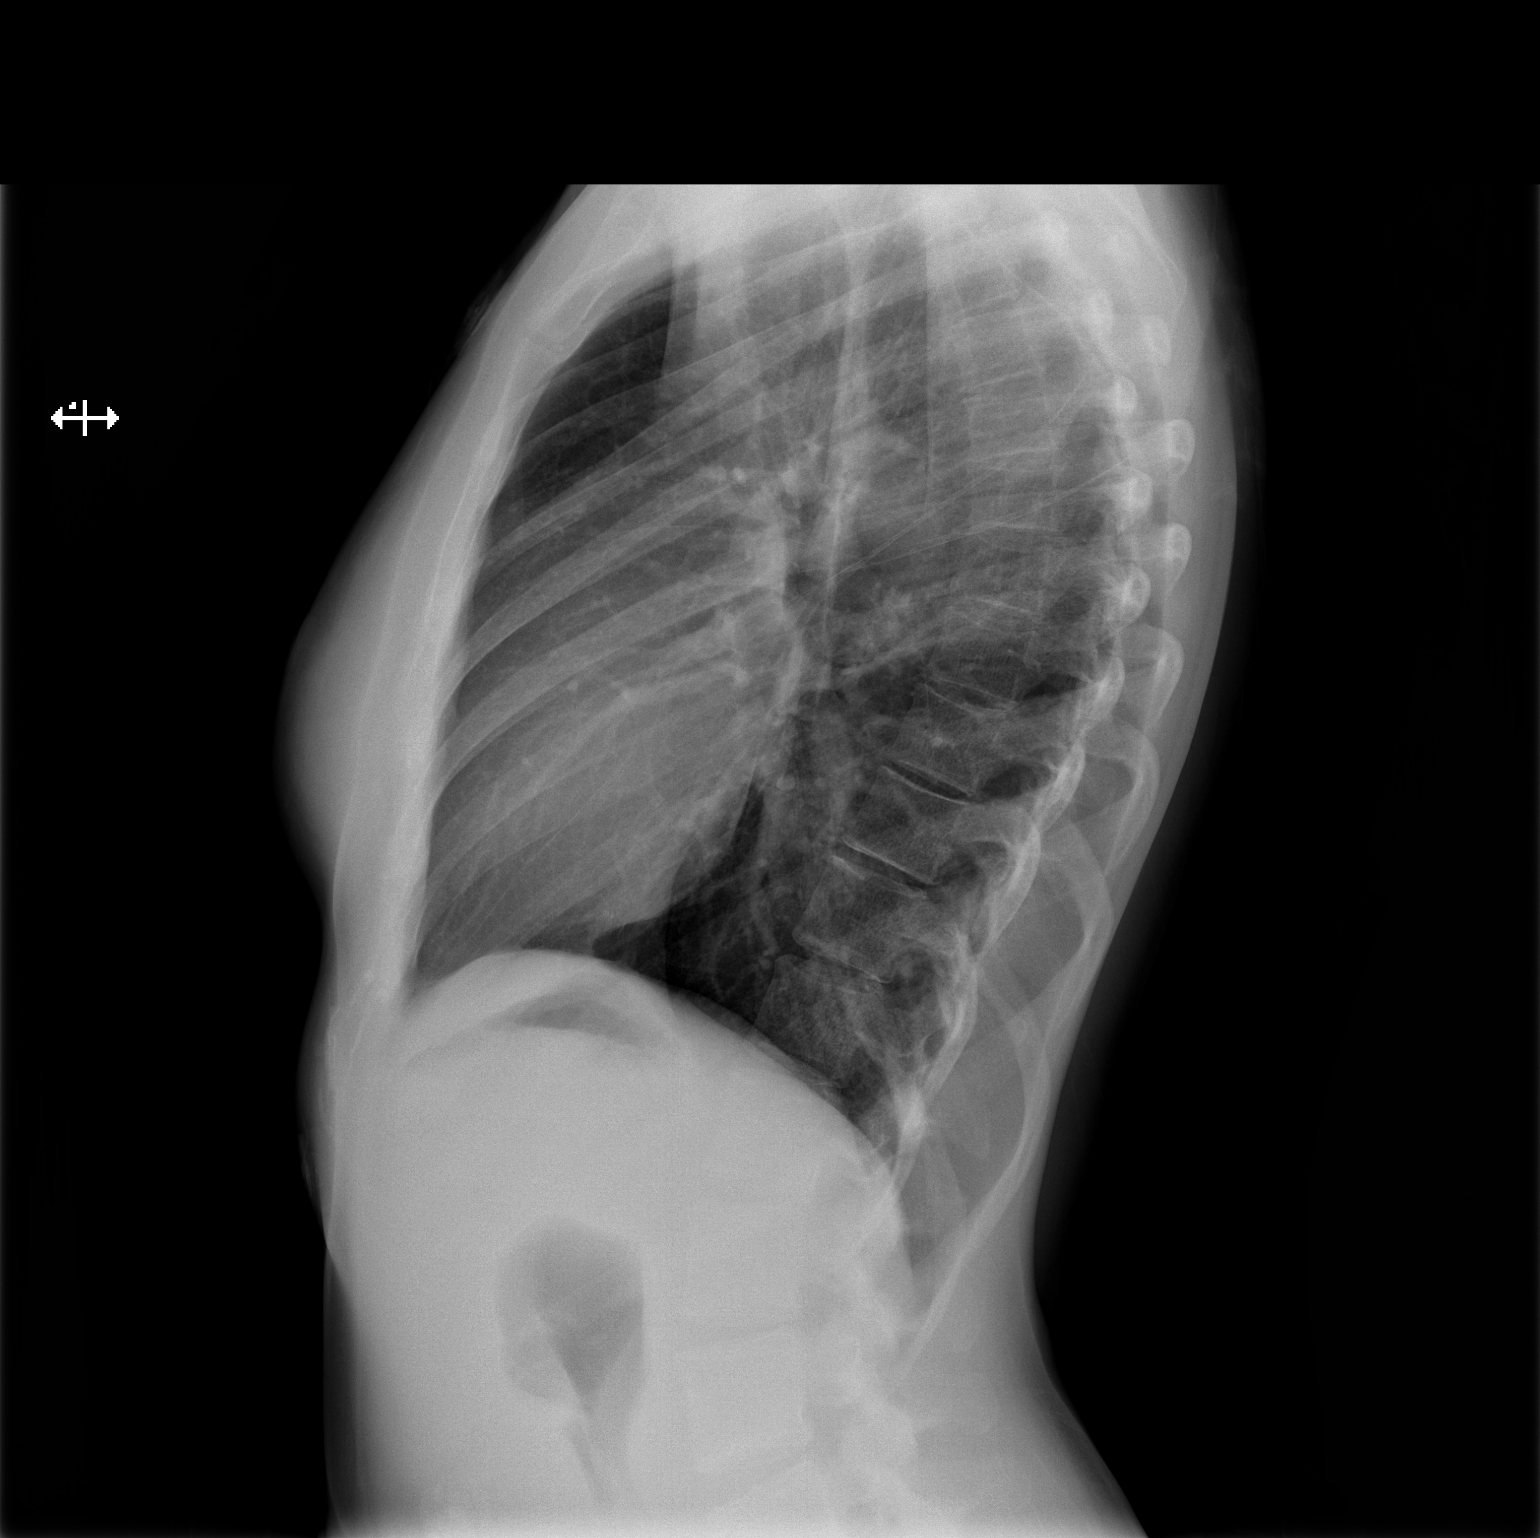

[2 of 2 positions shown; findings below may reference images not displayed]

FINDINGS: Normal mediastinum and cardiac silhouette.  Normal
pulmonary  vasculature.  No evidence of effusion, infiltrate, or
pneumothorax.  No acute bony abnormality.  Sigmoid scoliosis.
IMPRESSION: No acute cardiopulmonary process.

## 2014-03-28 ENCOUNTER — Emergency Department (HOSPITAL_COMMUNITY)
Admission: EM | Admit: 2014-03-28 | Discharge: 2014-03-28 | Disposition: A | Discharge status: Home / Self Care | Payer: PRIVATE HEALTH INSURANCE | Attending: Emergency Medicine | Admitting: Emergency Medicine

## 2014-03-28 ENCOUNTER — Encounter (HOSPITAL_COMMUNITY): Payer: Self-pay | Admitting: Emergency Medicine

## 2014-03-28 DIAGNOSIS — H579 Unspecified disorder of eye and adnexa: Secondary | ICD-10-CM | POA: Insufficient documentation

## 2014-03-28 DIAGNOSIS — H5789 Other specified disorders of eye and adnexa: Secondary | ICD-10-CM

## 2014-03-28 DIAGNOSIS — Z79899 Other long term (current) drug therapy: Secondary | ICD-10-CM | POA: Insufficient documentation

## 2014-03-28 MED ORDER — FLUORESCEIN SODIUM 1 MG OP STRP
1.0000 | ORAL_STRIP | Freq: Once | OPHTHALMIC | Status: DC
  Filled 2014-03-28: qty 1
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
## 2014-03-28 NOTE — ED Provider Notes (Signed)
Medical screening examination/treatment/procedure(s) were performed by non-physician practitioner and as supervising physician I was immediately available for consultation/collaboration.   EKG Interpretation None       Derwood KaplanAnkit Nanavati, MD 03/28/14 2337

## 2014-04-20 ENCOUNTER — Emergency Department (HOSPITAL_BASED_OUTPATIENT_CLINIC_OR_DEPARTMENT_OTHER)
Admission: EM | Admit: 2014-04-20 | Discharge: 2014-04-20 | Disposition: A | Discharge status: Home / Self Care | Payer: No Typology Code available for payment source | Attending: Emergency Medicine | Admitting: Emergency Medicine

## 2014-04-20 ENCOUNTER — Encounter (HOSPITAL_BASED_OUTPATIENT_CLINIC_OR_DEPARTMENT_OTHER): Payer: Self-pay | Admitting: Emergency Medicine

## 2014-04-20 DIAGNOSIS — IMO0002 Reserved for concepts with insufficient information to code with codable children: Secondary | ICD-10-CM | POA: Insufficient documentation

## 2014-04-20 DIAGNOSIS — Y92410 Unspecified street and highway as the place of occurrence of the external cause: Secondary | ICD-10-CM | POA: Insufficient documentation

## 2014-04-20 DIAGNOSIS — T148XXA Other injury of unspecified body region, initial encounter: Secondary | ICD-10-CM

## 2014-04-20 DIAGNOSIS — Y9389 Activity, other specified: Secondary | ICD-10-CM | POA: Insufficient documentation

## 2014-04-20 MED ORDER — IBUPROFEN 600 MG PO TABS: 600.0000 mg | ORAL_TABLET | Freq: Four times a day (QID) | ORAL | Status: AC | PRN
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
## 2014-06-26 ENCOUNTER — Emergency Department (HOSPITAL_BASED_OUTPATIENT_CLINIC_OR_DEPARTMENT_OTHER): Payer: 59

## 2014-06-26 ENCOUNTER — Emergency Department (HOSPITAL_BASED_OUTPATIENT_CLINIC_OR_DEPARTMENT_OTHER)
Admission: EM | Admit: 2014-06-26 | Discharge: 2014-06-26 | Disposition: A | Discharge status: Home / Self Care | Payer: 59 | Attending: Emergency Medicine | Admitting: Emergency Medicine

## 2014-06-26 ENCOUNTER — Encounter (HOSPITAL_BASED_OUTPATIENT_CLINIC_OR_DEPARTMENT_OTHER): Payer: Self-pay | Admitting: Emergency Medicine

## 2014-06-26 DIAGNOSIS — N12 Tubulo-interstitial nephritis, not specified as acute or chronic: Secondary | ICD-10-CM | POA: Insufficient documentation

## 2014-06-26 DIAGNOSIS — Z791 Long term (current) use of non-steroidal anti-inflammatories (NSAID): Secondary | ICD-10-CM | POA: Diagnosis not present

## 2014-06-26 DIAGNOSIS — Z9889 Other specified postprocedural states: Secondary | ICD-10-CM | POA: Diagnosis not present

## 2014-06-26 DIAGNOSIS — R109 Unspecified abdominal pain: Secondary | ICD-10-CM | POA: Insufficient documentation

## 2014-06-26 DIAGNOSIS — Z3202 Encounter for pregnancy test, result negative: Secondary | ICD-10-CM | POA: Insufficient documentation

## 2014-06-26 DIAGNOSIS — N2889 Other specified disorders of kidney and ureter: Secondary | ICD-10-CM | POA: Insufficient documentation

## 2014-06-26 LAB — URINALYSIS, ROUTINE W REFLEX MICROSCOPIC
Bilirubin Urine: NEGATIVE
Glucose, UA: NEGATIVE mg/dL
KETONES UR: NEGATIVE mg/dL
NITRITE: NEGATIVE
PH: 5 (ref 5.0–8.0)
PROTEIN: 100 mg/dL — AB
Specific Gravity, Urine: 1.016 (ref 1.005–1.030)
Urobilinogen, UA: 0.2 mg/dL (ref 0.0–1.0)

## 2014-06-26 LAB — URINE MICROSCOPIC-ADD ON

## 2014-06-26 LAB — PREGNANCY, URINE: Preg Test, Ur: NEGATIVE

## 2014-06-26 MED ORDER — CIPROFLOXACIN HCL 500 MG PO TABS: ORAL_TABLET | ORAL | Status: AC

## 2014-06-26 MED ORDER — HYDROMORPHONE HCL PF 1 MG/ML IJ SOLN
1.0000 mg | Freq: Once | INTRAMUSCULAR | Status: AC
  Administered 2014-06-26: 1 mg via INTRAVENOUS

## 2014-06-26 MED ORDER — HYDROMORPHONE HCL PF 1 MG/ML IJ SOLN
INTRAMUSCULAR | Status: AC
  Filled 2014-06-26: qty 1

## 2014-06-26 MED ORDER — SODIUM CHLORIDE 0.9 % IV SOLN
INTRAVENOUS | Status: DC
  Administered 2014-06-26: 03:00:00 via INTRAVENOUS

## 2014-06-26 MED ORDER — ONDANSETRON HCL 4 MG/2ML IJ SOLN
4.0000 mg | Freq: Once | INTRAMUSCULAR | Status: AC
  Administered 2014-06-26: 4 mg via INTRAVENOUS

## 2014-06-26 MED ORDER — PROMETHAZINE HCL 25 MG PO TABS: 25.0000 mg | ORAL_TABLET | Freq: Four times a day (QID) | ORAL | Status: AC | PRN

## 2014-06-26 MED ORDER — HYDROMORPHONE HCL 4 MG PO TABS: 4.0000 mg | ORAL_TABLET | ORAL | Status: AC | PRN

## 2014-06-26 MED ORDER — FLUCONAZOLE 50 MG PO TABS
150.0000 mg | ORAL_TABLET | Freq: Once | ORAL | Status: AC
  Administered 2014-06-26: 150 mg via ORAL
  Filled 2014-06-26 (×2): qty 1

## 2014-06-26 MED ORDER — FLUCONAZOLE 150 MG PO TABS: ORAL_TABLET | ORAL | Status: AC

## 2014-06-26 MED ORDER — ONDANSETRON HCL 4 MG/2ML IJ SOLN
INTRAMUSCULAR | Status: AC
  Filled 2014-06-26: qty 2

## 2014-06-26 MED ORDER — CEFTRIAXONE SODIUM 1 G IJ SOLR
INTRAMUSCULAR | Status: AC
  Filled 2014-06-26: qty 10

## 2014-06-26 MED ORDER — DEXTROSE 5 % IV SOLN
1.0000 g | Freq: Once | INTRAVENOUS | Status: AC
  Administered 2014-06-26: 1 g via INTRAVENOUS

## 2014-06-26 NOTE — ED Provider Notes (Addendum)
CSN: [id number]     Arrival date & time 06/26/14  0245 History   First MD Initiated Contact with Patient 06/26/14 0304     Chief Complaint  Patient presents with  . Flank Pain     (Consider location/radiation/quality/duration/timing/severity/associated sxs/prior Treatment) HPI This is a 33 year old female with about a one-week history of right flank pain and hematuria. The pain acutely worsened over the past several hours and is now located more prominently in the right abdomen. She describes it as a sharp pain. It is minimally changed with palpation or movement. It has been associated with nausea but no vomiting. She has had no diarrhea. She has had no vaginal bleeding or discharge. She describes the pain as severe at the present time.  History reviewed. No pertinent past medical history. Past Surgical History  Procedure Laterality Date  . Cesarean section    . Cesarean section     History reviewed. No pertinent family history. History  Substance Use Topics  . Smoking status: Never Smoker   . Smokeless tobacco: Not on file  . Alcohol Use: Yes   OB History   Grav Para Term Preterm Abortions TAB SAB Ect Mult Living                 Review of Systems  All other systems reviewed and are negative.   Allergies  Review of patient's allergies indicates no known allergies.  Home Medications   Prior to Admission medications   Medication Sig Start Date End Date Taking? Authorizing Provider  ibuprofen (ADVIL,MOTRIN) 600 MG tablet Take 1 tablet (600 mg total) by mouth every 6 (six) hours as needed. 04/20/14   Linwood Dibbles, MD  Multiple Vitamins-Minerals (WOMENS DAILY FORMULA PO) Take 1 tablet by mouth daily.    Historical Provider, MD   BP 134/66  Pulse 72  Temp(Src) 97.8 F (36.6 C) (Oral)  Resp 20  Ht  (1.651 m)  Wt 125 lb (56.7 kg)  BMI 20.80 kg/m2  SpO2 100%  LMP 05/31/2014  Physical Exam General: Well-developed, well-nourished female in obvious discomfort; appearance  consistent with age of record HENT: normocephalic; atraumatic Eyes: pupils equal, round and reactive to light; extraocular muscles intact Neck: supple Heart: regular rate and rhythm Lungs: clear to auscultation bilaterally Abdomen: soft; nondistended; mild right-sided tenderness; no masses or hepatosplenomegaly; bowel sounds present GU: no CVA tenderness Extremities: No deformity; full range of motion; pulses normal Neurologic: Awake, alert and oriented; motor function intact in all extremities and symmetric; no facial droop Skin: Warm and dry Psychiatric: tearful    ED Course  Procedures (including critical care time)  MDM   Nursing notes and vitals signs, including pulse oximetry, reviewed.  Summary of this visit's results, reviewed by myself:  Labs:  Results for orders placed during the hospital encounter of 06/26/14 (from the past 24 hour(s))  URINALYSIS, ROUTINE W REFLEX MICROSCOPIC     Status: Abnormal   Collection Time    06/26/14  2:58 AM      Result Value Ref Range   Color, Urine RED (*) YELLOW   APPearance CLOUDY (*) CLEAR   Specific Gravity, Urine 1.016  1.005 - 1.030   pH 5.0  5.0 - 8.0   Glucose, UA NEGATIVE  NEGATIVE mg/dL   Hgb urine dipstick LARGE (*) NEGATIVE   Bilirubin Urine NEGATIVE  NEGATIVE   Ketones, ur NEGATIVE  NEGATIVE mg/dL   Protein, ur 782 (*) NEGATIVE mg/dL   Urobilinogen, UA 0.2  0.0 - 1.0  mg/dL   Nitrite NEGATIVE  NEGATIVE   Leukocytes, UA MODERATE (*) NEGATIVE  PREGNANCY, URINE     Status: None   Collection Time    06/26/14  2:58 AM      Result Value Ref Range   Preg Test, Ur NEGATIVE  NEGATIVE  URINE MICROSCOPIC-ADD ON     Status: Abnormal   Collection Time    06/26/14  2:58 AM      Result Value Ref Range   Squamous Epithelial / LPF MANY (*) RARE   WBC, UA TOO NUMEROUS TO COUNT  <3 WBC/hpf   RBC / HPF TOO NUMEROUS TO COUNT  <3 RBC/hpf   Bacteria, UA MANY (*) RARE   Urine-Other MANY YEAST      Imaging Studies: Ct Abdomen  Pelvis Wo Contrast  06/26/2014   CLINICAL DATA:  RIGHT flank pain for 1 week, now worsening.  EXAM: CT ABDOMEN AND PELVIS WITHOUT CONTRAST  TECHNIQUE: Multidetector CT imaging of the abdomen and pelvis was performed following the standard protocol without IV contrast.  COMPARISON:  Pelvic ultrasound November 11, 2008  FINDINGS: LUNG BASES: Included view of the lung bases are clear. The visualized heart and pericardium are unremarkable.  KIDNEYS/BLADDER: The kidneys are orthotopic. The RIGHT kidney appears somewhat enlarged, diffusely hypodense, without urolithiasis. Effaced RIGHT renal sinus fat may reflect mild hydronephrosis or sequelae of edema. Ureters are normal in course and caliber. LEFT kidney is unremarkable. Urinary bladder is partially distended whole, harboring no intravesicular calculi.  SOLID ORGANS: The liver, spleen, gallbladder, pancreas and adrenal glands are unremarkable for this non-contrast examination.  GASTROINTESTINAL TRACT: The stomach, small and large bowel are normal in course and caliber without inflammatory changes, the sensitivity may be decreased by lack of enteric contrast. Normal appendix.  PERITONEUM/RETROPERITONEUM: No intraperitoneal free fluid nor free air. Aortoiliac vessels are normal in course and caliber. No lymphadenopathy by CT size criteria. Internal reproductive organs are normal; intrauterine device in situ.  SOFT TISSUES/ OSSEOUS STRUCTURES: Nonsuspicious. Small fat containing umbilical hernia.  IMPRESSION: Mildly enlarged edematous RIGHT kidney with probable mild hydronephrosis concerning for pyelonephritis without obstructing urolithiasis.   Electronically Signed   By: Awilda Metro   On: 06/26/2014 04:45   4:53 AM Pain now well-controlled. Rocephin IV given for pyelonephritis. Suspect ureteral spasm secondary to bleeding with possible passage of clots.        Hanley Seamen, MD 06/26/14 1610  Hanley Seamen, MD 06/26/14 [id number]

## 2014-06-26 NOTE — ED Notes (Signed)
Pt reports that she developed right flank pain last week, but tonight pain was significantly worse

## 2014-06-26 NOTE — ED Notes (Signed)
Unable to provide urine sample at the moment 

## 2014-06-26 NOTE — ED Notes (Signed)
Awaiting her ride to pick her up

## 2014-06-27 LAB — URINE CULTURE

## 2015-11-10 IMAGING — CT CT ABD-PELV W/O CM
2 of 4 series · 16 of 46 positions shown, 18 images · non-contrast
Comparison: Pelvic ultrasound November 11, 2008

CLINICAL DATA: RIGHT flank pain for 1 week, now worsening.

EXAM:
CT ABDOMEN AND PELVIS WITHOUT CONTRAST
TECHNIQUE: Multidetector CT imaging of the abdomen and pelvis was performed
following the standard protocol without IV contrast.

[Series 2: renal stone < 200 lbs 5.0 b31f · axial · 0.76mm/px · z∈[+912,+1267]mm · 13 of 79 slices shown, 15 images]
[im 4/79  soft-tissue]
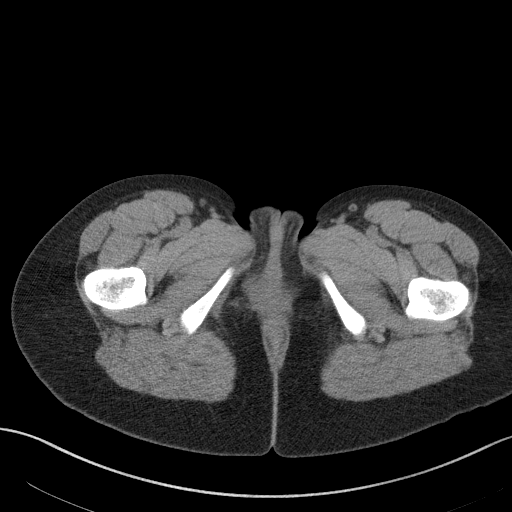
[im 4/79  bone]
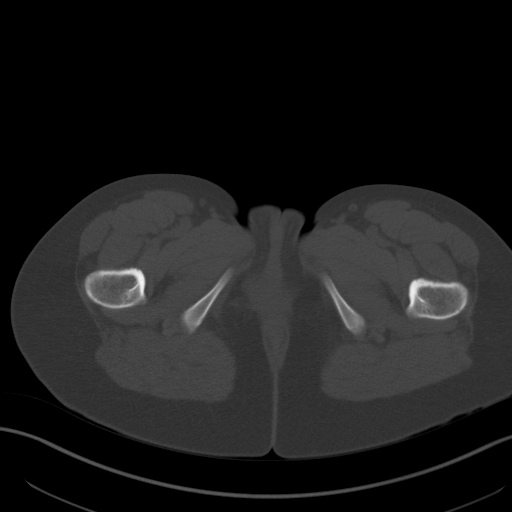
[im 11/79  soft-tissue]
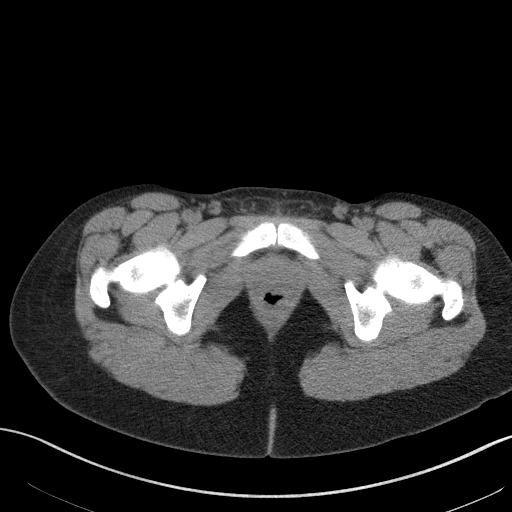
[im 18/79  soft-tissue]
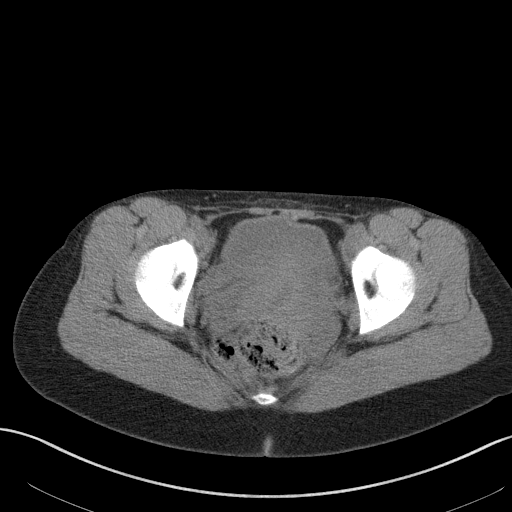
[im 22/79  soft-tissue]
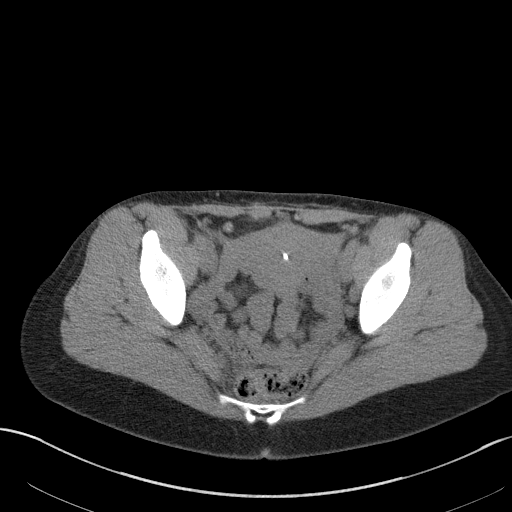
[im 29/79  soft-tissue]
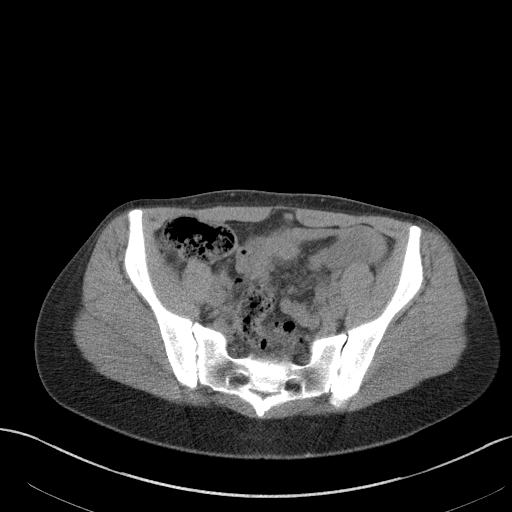
[im 32/79  soft-tissue]
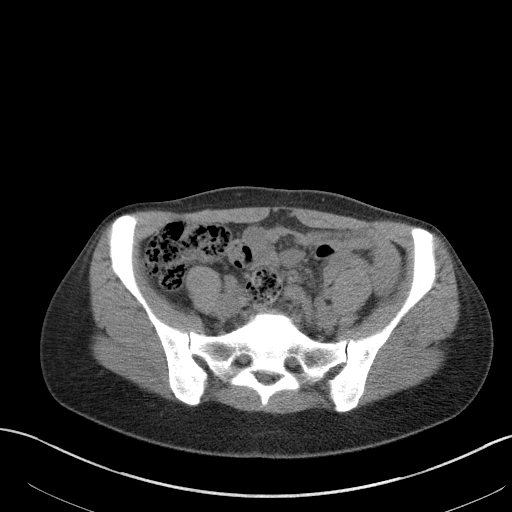
[im 40/79  soft-tissue]
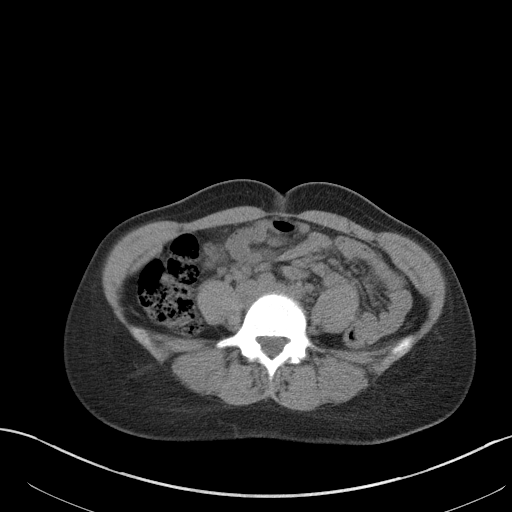
[im 47/79  soft-tissue]
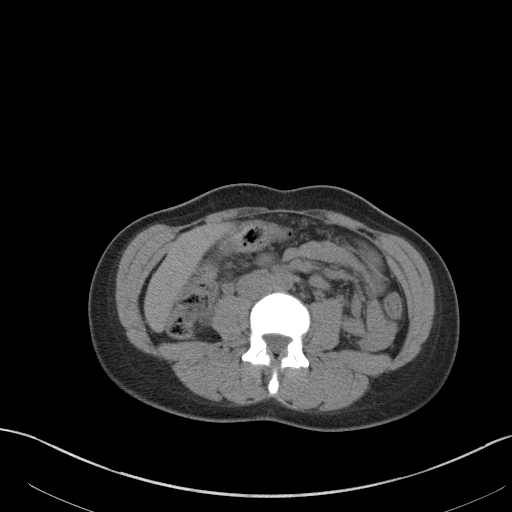
[im 50/79  soft-tissue]
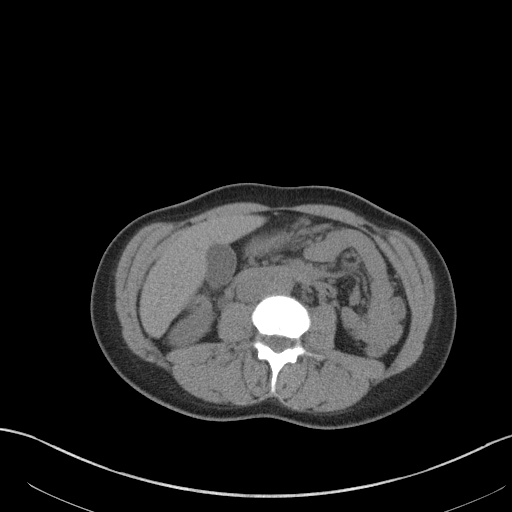
[im 50/79  bone]
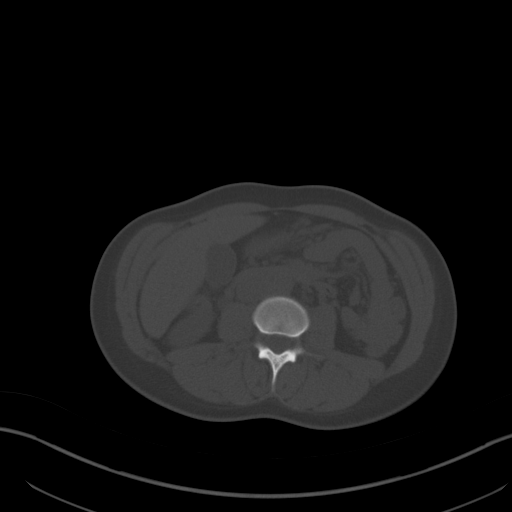
[im 57/79  soft-tissue]
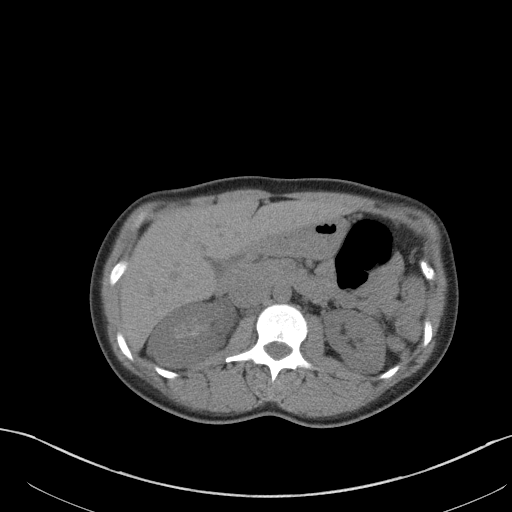
[im 61/79  soft-tissue]
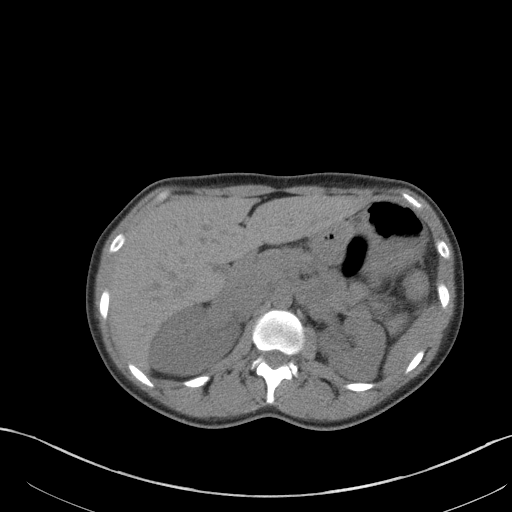
[im 68/79  soft-tissue]
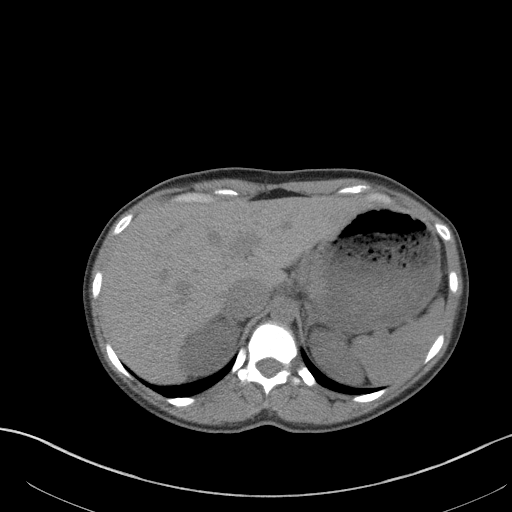
[im 75/79  soft-tissue]
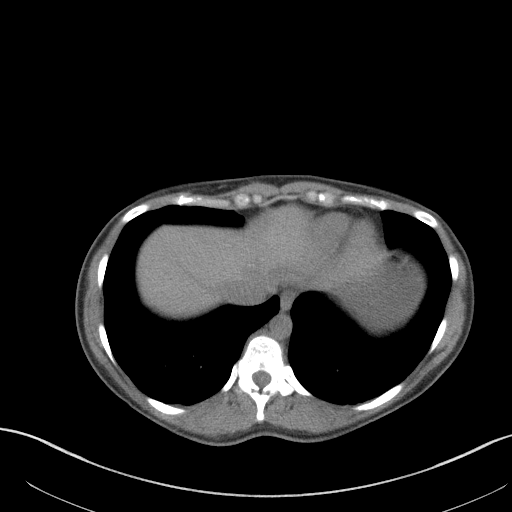

[Series 5: renal stone 3.0 coronal · coronal · 0.65mm/px · 3 of 69 slices shown]
[im 23/69  soft-tissue]
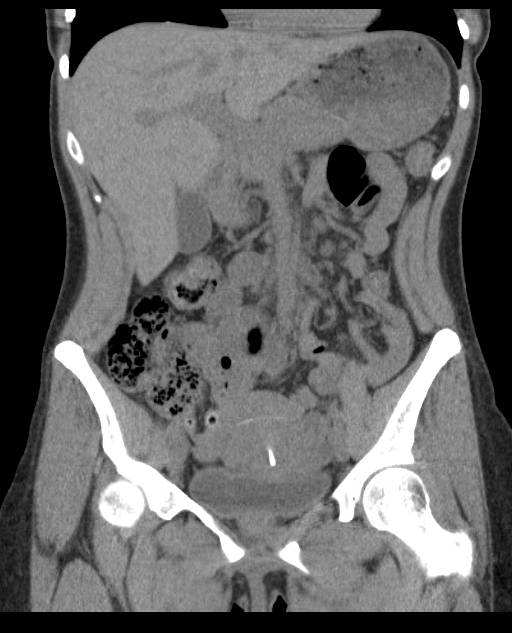
[im 31/69  soft-tissue]
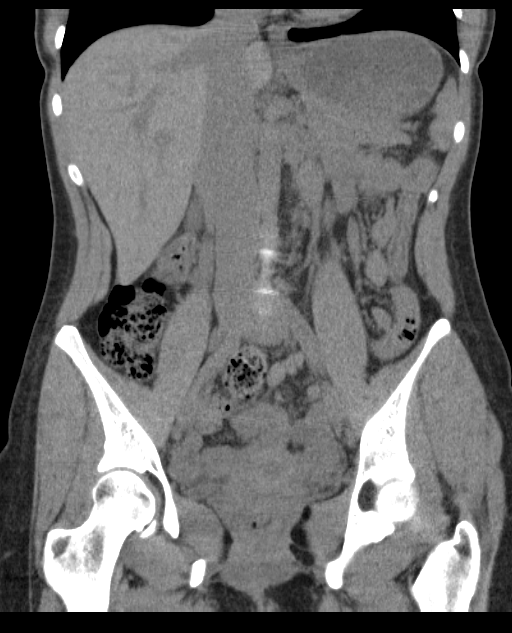
[im 38/69  soft-tissue]
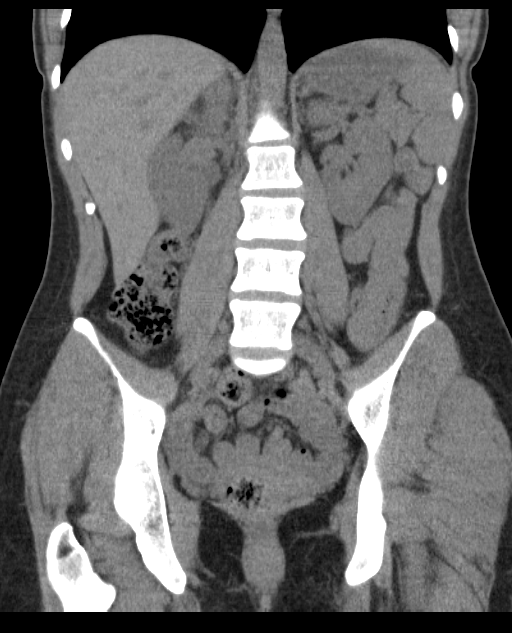

[16 of 46 positions shown; findings below may reference images not displayed]

FINDINGS: LUNG BASES: Included view of the lung bases are clear. The
visualized heart and pericardium are unremarkable.

KIDNEYS/BLADDER: The kidneys are orthotopic. The RIGHT kidney
appears somewhat enlarged, diffusely hypodense, without
urolithiasis. Effaced RIGHT renal sinus fat may reflect mild
hydronephrosis or sequelae of edema. Ureters are normal in course
and caliber. LEFT kidney is unremarkable. Urinary bladder is
partially distended whole, harboring no intravesicular calculi.

SOLID ORGANS: The liver, spleen, gallbladder, pancreas and adrenal
glands are unremarkable for this non-contrast examination.

GASTROINTESTINAL TRACT: The stomach, small and large bowel are
normal in course and caliber without inflammatory changes, the
sensitivity may be decreased by lack of enteric contrast. Normal
appendix.

PERITONEUM/RETROPERITONEUM: No intraperitoneal free fluid nor free
air. Aortoiliac vessels are normal in course and caliber. No
lymphadenopathy by CT size criteria. Internal reproductive organs
are normal; intrauterine device in situ.

SOFT TISSUES/ OSSEOUS STRUCTURES: Nonsuspicious. Small fat
containing umbilical hernia.
IMPRESSION: Mildly enlarged edematous RIGHT kidney with probable mild
hydronephrosis concerning for pyelonephritis without obstructing
urolithiasis.

  By: Katjuu Bogosi

## 2016-05-28 DIAGNOSIS — H6501 Acute serous otitis media, right ear: Secondary | ICD-10-CM | POA: Diagnosis not present

## 2016-05-28 DIAGNOSIS — H6981 Other specified disorders of Eustachian tube, right ear: Secondary | ICD-10-CM | POA: Diagnosis not present

## 2016-08-02 DIAGNOSIS — B9689 Other specified bacterial agents as the cause of diseases classified elsewhere: Secondary | ICD-10-CM | POA: Diagnosis not present

## 2016-08-02 DIAGNOSIS — R030 Elevated blood-pressure reading, without diagnosis of hypertension: Secondary | ICD-10-CM | POA: Diagnosis not present

## 2016-08-02 DIAGNOSIS — N760 Acute vaginitis: Secondary | ICD-10-CM | POA: Diagnosis not present

## 2016-08-02 DIAGNOSIS — Z0000 Encounter for general adult medical examination without abnormal findings: Secondary | ICD-10-CM | POA: Diagnosis not present

## 2016-12-03 DIAGNOSIS — N760 Acute vaginitis: Secondary | ICD-10-CM | POA: Diagnosis not present

## 2016-12-03 DIAGNOSIS — Z3009 Encounter for other general counseling and advice on contraception: Secondary | ICD-10-CM | POA: Diagnosis not present

## 2016-12-03 DIAGNOSIS — Z30432 Encounter for removal of intrauterine contraceptive device: Secondary | ICD-10-CM | POA: Diagnosis not present

## 2016-12-03 DIAGNOSIS — Z113 Encounter for screening for infections with a predominantly sexual mode of transmission: Secondary | ICD-10-CM | POA: Diagnosis not present

## 2016-12-03 DIAGNOSIS — N898 Other specified noninflammatory disorders of vagina: Secondary | ICD-10-CM | POA: Diagnosis not present

## 2017-04-05 DIAGNOSIS — L218 Other seborrheic dermatitis: Secondary | ICD-10-CM | POA: Diagnosis not present

## 2017-04-05 DIAGNOSIS — L669 Cicatricial alopecia, unspecified: Secondary | ICD-10-CM | POA: Diagnosis not present

## 2017-11-09 ENCOUNTER — Other Ambulatory Visit: Payer: Self-pay

## 2017-11-09 ENCOUNTER — Emergency Department (HOSPITAL_BASED_OUTPATIENT_CLINIC_OR_DEPARTMENT_OTHER)
Admission: EM | Admit: 2017-11-09 | Discharge: 2017-11-09 | Disposition: A | Discharge status: Home / Self Care | Payer: 59 | Attending: Emergency Medicine | Admitting: Emergency Medicine

## 2017-11-09 ENCOUNTER — Encounter (HOSPITAL_BASED_OUTPATIENT_CLINIC_OR_DEPARTMENT_OTHER): Payer: Self-pay | Admitting: *Deleted

## 2017-11-09 DIAGNOSIS — Y9389 Activity, other specified: Secondary | ICD-10-CM | POA: Insufficient documentation

## 2017-11-09 DIAGNOSIS — Y998 Other external cause status: Secondary | ICD-10-CM | POA: Insufficient documentation

## 2017-11-09 DIAGNOSIS — M545 Low back pain, unspecified: Secondary | ICD-10-CM

## 2017-11-09 DIAGNOSIS — Y92410 Unspecified street and highway as the place of occurrence of the external cause: Secondary | ICD-10-CM | POA: Insufficient documentation

## 2017-11-09 DIAGNOSIS — M79622 Pain in left upper arm: Secondary | ICD-10-CM | POA: Diagnosis not present

## 2017-11-09 DIAGNOSIS — Z79899 Other long term (current) drug therapy: Secondary | ICD-10-CM | POA: Insufficient documentation

## 2017-11-09 MED ORDER — ACETAMINOPHEN 500 MG PO TABS
1000.0000 mg | ORAL_TABLET | Freq: Once | ORAL | Status: DC
  Filled 2017-11-09: qty 2
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
## 2017-11-09 NOTE — Discharge Instructions (Signed)
Alternate 600 mg of ibuprofen and 500-1000 mg of Tylenol every 3 hours as needed for pain. Do not exceed 4000 mg of Tylenol daily. Ice to areas of soreness for the next few days and then may move to heat. Do some gentle stretching throughout the day, especially during hot showers or baths. Take short frequent walks and avoid prolonged periods of sitting or laying. Expect to be sore for the next few day and follow up with primary care physician for recheck of ongoing symptoms but return to ER for emergent changing or worsening of symptoms such as severe headache that gets worse, altered mental status/behaving unusually, persistent vomiting, excessive drowsiness, numbness to the arms or legs, unsteady gait, or slurred speech. ° °

## 2018-04-05 DIAGNOSIS — R5383 Other fatigue: Secondary | ICD-10-CM | POA: Diagnosis not present

## 2018-04-05 DIAGNOSIS — Z0000 Encounter for general adult medical examination without abnormal findings: Secondary | ICD-10-CM | POA: Diagnosis not present

## 2018-08-03 DIAGNOSIS — L658 Other specified nonscarring hair loss: Secondary | ICD-10-CM | POA: Diagnosis not present

## 2018-08-03 DIAGNOSIS — L668 Other cicatricial alopecia: Secondary | ICD-10-CM | POA: Diagnosis not present

## 2020-01-09 DIAGNOSIS — H5213 Myopia, bilateral: Secondary | ICD-10-CM | POA: Diagnosis not present

## 2020-04-09 DIAGNOSIS — B373 Candidiasis of vulva and vagina: Secondary | ICD-10-CM | POA: Diagnosis not present

## 2020-04-09 DIAGNOSIS — B9689 Other specified bacterial agents as the cause of diseases classified elsewhere: Secondary | ICD-10-CM | POA: Diagnosis not present

## 2020-04-09 DIAGNOSIS — N760 Acute vaginitis: Secondary | ICD-10-CM | POA: Diagnosis not present

## 2020-04-09 DIAGNOSIS — N898 Other specified noninflammatory disorders of vagina: Secondary | ICD-10-CM | POA: Diagnosis not present

## 2020-04-09 DIAGNOSIS — Z0001 Encounter for general adult medical examination with abnormal findings: Secondary | ICD-10-CM | POA: Diagnosis not present

## 2020-04-09 DIAGNOSIS — Z0000 Encounter for general adult medical examination without abnormal findings: Secondary | ICD-10-CM | POA: Diagnosis not present

## 2020-04-09 DIAGNOSIS — Z01411 Encounter for gynecological examination (general) (routine) with abnormal findings: Secondary | ICD-10-CM | POA: Diagnosis not present

## 2020-04-23 ENCOUNTER — Ambulatory Visit: Payer: 59 | Attending: Internal Medicine

## 2020-04-23 DIAGNOSIS — Z20822 Contact with and (suspected) exposure to covid-19: Secondary | ICD-10-CM

## 2020-04-24 LAB — SARS-COV-2, NAA 2 DAY TAT

## 2020-04-24 LAB — NOVEL CORONAVIRUS, NAA: SARS-CoV-2, NAA: NOT DETECTED

## 2020-05-07 DIAGNOSIS — Z20822 Contact with and (suspected) exposure to covid-19: Secondary | ICD-10-CM | POA: Diagnosis not present

## 2020-11-04 DIAGNOSIS — L669 Cicatricial alopecia, unspecified: Secondary | ICD-10-CM | POA: Diagnosis not present

## 2020-11-04 DIAGNOSIS — L658 Other specified nonscarring hair loss: Secondary | ICD-10-CM | POA: Diagnosis not present

## 2020-12-03 ENCOUNTER — Other Ambulatory Visit: Payer: Self-pay

## 2020-12-03 ENCOUNTER — Ambulatory Visit: Payer: 59 | Attending: Internal Medicine

## 2020-12-03 DIAGNOSIS — Z23 Encounter for immunization: Secondary | ICD-10-CM

## 2020-12-03 NOTE — Progress Notes (Signed)
   Covid-19 Vaccination Clinic  Name:  [name]    MRN: [id number] DOB: Aug 08, 1981  12/03/2020  [name] was observed post Covid-19 immunization for 15 minutes without incident. She was provided with Vaccine Information Sheet and instruction to access the V-Safe system.   [name] was instructed to call 911 with any severe reactions post vaccine: Marland Kitchen Difficulty breathing  . Swelling of face and throat  . A fast heartbeat  . A bad rash all over body  . Dizziness and weakness   Immunizations Administered    Name Date Dose VIS Date Route   PFIZER Comrnaty(Gray TOP) Covid-19 Vaccine 12/03/2020  1:10 PM 0.3 mL 09/18/2020 Intramuscular   Manufacturer: [employer], [employer]   Lot: [id number]   NDC: [phone]

## 2021-02-03 DIAGNOSIS — L669 Cicatricial alopecia, unspecified: Secondary | ICD-10-CM | POA: Diagnosis not present

## 2021-02-03 DIAGNOSIS — L658 Other specified nonscarring hair loss: Secondary | ICD-10-CM | POA: Diagnosis not present

## 2021-04-10 DIAGNOSIS — N926 Irregular menstruation, unspecified: Secondary | ICD-10-CM | POA: Diagnosis not present

## 2021-04-10 DIAGNOSIS — L309 Dermatitis, unspecified: Secondary | ICD-10-CM | POA: Diagnosis not present

## 2021-04-10 DIAGNOSIS — Z1329 Encounter for screening for other suspected endocrine disorder: Secondary | ICD-10-CM | POA: Diagnosis not present

## 2021-04-10 DIAGNOSIS — Z13220 Encounter for screening for lipoid disorders: Secondary | ICD-10-CM | POA: Diagnosis not present

## 2021-04-10 DIAGNOSIS — Z0000 Encounter for general adult medical examination without abnormal findings: Secondary | ICD-10-CM | POA: Diagnosis not present

## 2021-04-10 DIAGNOSIS — Z0001 Encounter for general adult medical examination with abnormal findings: Secondary | ICD-10-CM | POA: Diagnosis not present

## 2021-04-10 DIAGNOSIS — Z8249 Family history of ischemic heart disease and other diseases of the circulatory system: Secondary | ICD-10-CM | POA: Diagnosis not present

## 2021-05-08 DIAGNOSIS — L658 Other specified nonscarring hair loss: Secondary | ICD-10-CM | POA: Diagnosis not present

## 2021-05-08 DIAGNOSIS — L669 Cicatricial alopecia, unspecified: Secondary | ICD-10-CM | POA: Diagnosis not present

## 2021-08-18 DIAGNOSIS — L669 Cicatricial alopecia, unspecified: Secondary | ICD-10-CM | POA: Diagnosis not present

## 2021-08-18 DIAGNOSIS — L658 Other specified nonscarring hair loss: Secondary | ICD-10-CM | POA: Diagnosis not present

## 2021-10-08 DIAGNOSIS — L669 Cicatricial alopecia, unspecified: Secondary | ICD-10-CM | POA: Diagnosis not present

## 2021-12-30 DIAGNOSIS — L668 Other cicatricial alopecia: Secondary | ICD-10-CM | POA: Diagnosis not present

## 2021-12-30 DIAGNOSIS — L658 Other specified nonscarring hair loss: Secondary | ICD-10-CM | POA: Diagnosis not present

## 2022-03-23 DIAGNOSIS — L658 Other specified nonscarring hair loss: Secondary | ICD-10-CM | POA: Diagnosis not present

## 2022-03-23 DIAGNOSIS — L668 Other cicatricial alopecia: Secondary | ICD-10-CM | POA: Diagnosis not present

## 2022-04-14 DIAGNOSIS — Z23 Encounter for immunization: Secondary | ICD-10-CM | POA: Diagnosis not present

## 2022-04-14 DIAGNOSIS — Z13220 Encounter for screening for lipoid disorders: Secondary | ICD-10-CM | POA: Diagnosis not present

## 2022-04-14 DIAGNOSIS — Z0000 Encounter for general adult medical examination without abnormal findings: Secondary | ICD-10-CM | POA: Diagnosis not present

## 2022-04-26 DIAGNOSIS — Z1231 Encounter for screening mammogram for malignant neoplasm of breast: Secondary | ICD-10-CM | POA: Diagnosis not present

## 2022-07-02 DIAGNOSIS — L658 Other specified nonscarring hair loss: Secondary | ICD-10-CM | POA: Diagnosis not present

## 2022-07-02 DIAGNOSIS — Z79899 Other long term (current) drug therapy: Secondary | ICD-10-CM | POA: Diagnosis not present

## 2022-07-02 DIAGNOSIS — L668 Other cicatricial alopecia: Secondary | ICD-10-CM | POA: Diagnosis not present

## 2022-10-13 DIAGNOSIS — L658 Other specified nonscarring hair loss: Secondary | ICD-10-CM | POA: Diagnosis not present

## 2022-10-13 DIAGNOSIS — Z79899 Other long term (current) drug therapy: Secondary | ICD-10-CM | POA: Diagnosis not present

## 2022-10-13 DIAGNOSIS — L668 Other cicatricial alopecia: Secondary | ICD-10-CM | POA: Diagnosis not present

## 2023-04-13 DIAGNOSIS — L658 Other specified nonscarring hair loss: Secondary | ICD-10-CM | POA: Diagnosis not present

## 2023-04-13 DIAGNOSIS — L668 Other cicatricial alopecia: Secondary | ICD-10-CM | POA: Diagnosis not present

## 2023-04-13 DIAGNOSIS — Z79899 Other long term (current) drug therapy: Secondary | ICD-10-CM | POA: Diagnosis not present

## 2023-05-11 DIAGNOSIS — Z13220 Encounter for screening for lipoid disorders: Secondary | ICD-10-CM | POA: Diagnosis not present

## 2023-05-11 DIAGNOSIS — Z1231 Encounter for screening mammogram for malignant neoplasm of breast: Secondary | ICD-10-CM | POA: Diagnosis not present

## 2023-05-11 DIAGNOSIS — Z0000 Encounter for general adult medical examination without abnormal findings: Secondary | ICD-10-CM | POA: Diagnosis not present

## 2024-03-13 DIAGNOSIS — M25511 Pain in right shoulder: Secondary | ICD-10-CM | POA: Diagnosis not present

## 2024-05-07 DIAGNOSIS — L658 Other specified nonscarring hair loss: Secondary | ICD-10-CM | POA: Diagnosis not present

## 2024-05-07 DIAGNOSIS — L6681 Central centrifugal cicatricial alopecia: Secondary | ICD-10-CM | POA: Diagnosis not present

## 2024-05-07 DIAGNOSIS — Z79899 Other long term (current) drug therapy: Secondary | ICD-10-CM | POA: Diagnosis not present

## 2024-05-11 DIAGNOSIS — Z1231 Encounter for screening mammogram for malignant neoplasm of breast: Secondary | ICD-10-CM | POA: Diagnosis not present

## 2024-05-15 DIAGNOSIS — Z0000 Encounter for general adult medical examination without abnormal findings: Secondary | ICD-10-CM | POA: Diagnosis not present

## 2024-06-05 DIAGNOSIS — H52223 Regular astigmatism, bilateral: Secondary | ICD-10-CM | POA: Diagnosis not present

## 2024-06-05 DIAGNOSIS — H524 Presbyopia: Secondary | ICD-10-CM | POA: Diagnosis not present

## 2024-06-05 DIAGNOSIS — H5213 Myopia, bilateral: Secondary | ICD-10-CM | POA: Diagnosis not present

## 2024-09-04 DIAGNOSIS — N926 Irregular menstruation, unspecified: Secondary | ICD-10-CM | POA: Diagnosis not present

## 2024-09-04 DIAGNOSIS — R002 Palpitations: Secondary | ICD-10-CM | POA: Diagnosis not present

## 2024-09-04 DIAGNOSIS — R0689 Other abnormalities of breathing: Secondary | ICD-10-CM | POA: Diagnosis not present

## 2024-09-04 DIAGNOSIS — Z136 Encounter for screening for cardiovascular disorders: Secondary | ICD-10-CM | POA: Diagnosis not present

## 2024-09-04 DIAGNOSIS — I10 Essential (primary) hypertension: Secondary | ICD-10-CM | POA: Diagnosis not present
